# Patient Record
Sex: Male | Born: 1985
Health system: Southern US, Community
[De-identification: ages and names within clinical notes are randomized; demographics above are authoritative.]

## PROBLEM LIST (undated history)

## (undated) DIAGNOSIS — M25571 Pain in right ankle and joints of right foot: Secondary | ICD-10-CM

## (undated) DIAGNOSIS — G4700 Insomnia, unspecified: Secondary | ICD-10-CM

## (undated) DIAGNOSIS — Z720 Tobacco use: Secondary | ICD-10-CM

## (undated) DIAGNOSIS — F1021 Alcohol dependence, in remission: Secondary | ICD-10-CM

## (undated) DIAGNOSIS — B1920 Unspecified viral hepatitis C without hepatic coma: Secondary | ICD-10-CM

## (undated) HISTORY — DX: Unspecified viral hepatitis C without hepatic coma: B19.20

## (undated) HISTORY — DX: Pain in right ankle and joints of right foot: M25.571

## (undated) HISTORY — DX: Alcohol dependence, in remission: F10.21

## (undated) HISTORY — DX: Tobacco use: Z72.0

## (undated) HISTORY — DX: Insomnia, unspecified: G47.00

---

## 2015-05-30 ENCOUNTER — Emergency Department (INDEPENDENT_AMBULATORY_CARE_PROVIDER_SITE_OTHER)
Admission: EM | Admit: 2015-05-30 | Discharge: 2015-05-30 | Disposition: A | Discharge status: Home / Self Care | Payer: Self-pay | Source: Home / Self Care | Attending: Family Medicine | Admitting: Family Medicine

## 2015-05-30 ENCOUNTER — Encounter (HOSPITAL_COMMUNITY): Payer: Self-pay | Admitting: Emergency Medicine

## 2015-05-30 DIAGNOSIS — I7300 Raynaud's syndrome without gangrene: Secondary | ICD-10-CM

## 2015-05-30 MED ORDER — TRAZODONE HCL 100 MG PO TABS: 100.0000 mg | ORAL_TABLET | Freq: Every day | ORAL | Status: AC

## 2015-05-30 MED ORDER — GABAPENTIN 300 MG PO CAPS: 300.0000 mg | ORAL_CAPSULE | Freq: Three times a day (TID) | ORAL | Status: AC

## 2015-05-30 NOTE — ED Notes (Signed)
No fax received.  Family reports paper needed to be signed and refaxed to facility

## 2015-05-30 NOTE — ED Notes (Signed)
Patient has multiple issues.  Discharged from a rehab center on Friday 8/26, in [place].  Patient is in this area with family.  Family is concerned fro cyanotic appearance of bilateral thumbs and adjoining palms, right worse than left.  This has been present for 7 months.  No new changes.  Also, patient did not receive any prescriptions on discharge.  Concerned for sudden stop of medicines

## 2015-05-30 NOTE — ED Provider Notes (Signed)
CSN: [id number]     Arrival date & time 05/30/15  1457 History   First MD Initiated Contact with Patient 05/30/15 1627     Chief Complaint  Patient presents with  . Hand Problem   (Consider location/radiation/quality/duration/timing/severity/associated sxs/prior Treatment) Patient is a 29 y.o. male presenting with hand pain. The history is provided by the patient, a relative and a parent.  Hand Pain This is a chronic problem. Episode onset: 7 mos of cyanosis and numbness of right hand with transfer on fri from drug rehab center in [place]., here for complete health checkup eval and med refills. The problem has not changed since onset.Pertinent negatives include no chest pain, no abdominal pain and no shortness of breath.    No past medical history on file. No past surgical history on file. No family history on file. Social History  Substance Use Topics  . Smoking status: Current Every Day Smoker  . Smokeless tobacco: Not on file  . Alcohol Use: No    Review of Systems  Constitutional: Positive for fatigue. Negative for fever.  HENT: Positive for congestion, postnasal drip and rhinorrhea.   Respiratory: Negative for shortness of breath.        Current smoker  Cardiovascular: Negative.  Negative for chest pain.  Gastrointestinal: Negative.  Negative for abdominal pain.  Genitourinary: Negative.   Musculoskeletal: Negative.   Skin: Positive for color change and pallor.    Allergies  Review of patient's allergies indicates no known allergies.  Home Medications   Prior to Admission medications   Medication Sig Start Date End Date Taking? Authorizing Provider  OVER THE COUNTER MEDICATION Clonopin Neurontin trazadone   Yes Historical Provider, MD  gabapentin (NEURONTIN) 300 MG capsule Take 1 capsule (300 mg total) by mouth 3 (three) times daily. 05/30/15   Linna Hoff, MD  traZODone (DESYREL) 100 MG tablet Take 1 tablet (100 mg total) by mouth at bedtime. 05/30/15   Linna Hoff,  MD   Meds Ordered and Administered this Visit  Medications - No data to display  BP 137/92 mmHg  Pulse 58  Temp(Src) 98.3 F (36.8 C) (Oral)  Resp 16  SpO2 100% No data found.   Physical Exam  Constitutional: He is oriented to person, place, and time. He appears well-developed and well-nourished. No distress.  Eyes: Conjunctivae are normal. Pupils are equal, round, and reactive to light.  Neck: Normal range of motion. Neck supple.  Cardiovascular: Normal heart sounds.   Abdominal: There is no tenderness.  Lymphadenopathy:    He has no cervical adenopathy.  Neurological: He is alert and oriented to person, place, and time.  Skin: Rash noted. There is erythema.  Acral cyanosis of right hand, pulses 2+, full rom., sl milder sx to left hand  Nursing note and vitals reviewed.   ED Course  Procedures (including critical care time)  Labs Review Labs Reviewed - No data to display  Imaging Review No results found.   Visual Acuity Review  Right Eye Distance:   Left Eye Distance:   Bilateral Distance:    Right Eye Near:   Left Eye Near:    Bilateral Near:         MDM   1. Raynaud's phenomenon (secondary)        Linna Hoff, MD 05/30/15 [phone]

## 2015-05-30 NOTE — Discharge Instructions (Signed)
Use medicine as prescribed, see your doctor as planned. °

## 2015-06-03 ENCOUNTER — Other Ambulatory Visit: Payer: Self-pay | Admitting: Internal Medicine

## 2015-06-03 ENCOUNTER — Ambulatory Visit
Admission: RE | Admit: 2015-06-03 | Discharge: 2015-06-03 | Disposition: A | Discharge status: Home / Self Care | Payer: No Typology Code available for payment source | Source: Ambulatory Visit | Attending: Internal Medicine | Admitting: Internal Medicine

## 2015-06-03 DIAGNOSIS — M79644 Pain in right finger(s): Secondary | ICD-10-CM

## 2015-06-23 ENCOUNTER — Telehealth: Payer: Self-pay | Admitting: Lab

## 2015-06-23 NOTE — Telephone Encounter (Signed)
Lm for pt's Aunt to call me.  It is stated on referral that the pt is self pay-need to clarify because labs and meds are so expensive. Pt will need lab appmt.

## 2015-06-27 ENCOUNTER — Other Ambulatory Visit: Payer: Self-pay

## 2015-06-27 NOTE — Telephone Encounter (Signed)
On 9/23-In speaking with pt's Aunt-Denya Haymore, she said that the pt was in the process of getting approve for the charity program offered by [facility].  He couldn't qualify for the orange card because he is not a [facility] resident.  She said the paperwork should be completed by this day or the early part of next week.  She wanted me to hold a spot for her nephew but I told her I coudnt do that because I have other pts that treatment is on hold until they can get financial assistance in place.  She then calls me today stating that she had spoken with Pam-the financial aide person here-and Pam had stated that [facility] could get 55% discount when self pay and she wanted to make him an appmt based on that information.  In discussing this situation with Dave-he advised me to stop all contact with Denya until there is a "designated party of release" form signed because the pt is an adult.  So I shared this information with Denya and she said she would have Jonahtan call me today to make an appmt.  HE NEEDS LAB APPMT FIRST.

## 2015-06-27 NOTE — Telephone Encounter (Signed)
Pcp's office notified of status-Pt called-he is scheduled  for labs on 06/28/15  pm

## 2015-06-28 ENCOUNTER — Other Ambulatory Visit: Payer: Self-pay

## 2015-06-28 DIAGNOSIS — B182 Chronic viral hepatitis C: Secondary | ICD-10-CM

## 2015-06-28 NOTE — Telephone Encounter (Signed)
Pt came in to have labs today and was given an appmt with Dr. Luciana Axe for 07/27/15 @ 940.   Pcp's office informed (Tammy)

## 2015-07-05 ENCOUNTER — Other Ambulatory Visit: Payer: Self-pay | Admitting: Internal Medicine

## 2015-07-05 DIAGNOSIS — B1920 Unspecified viral hepatitis C without hepatic coma: Secondary | ICD-10-CM

## 2015-07-06 LAB — HEPATITIS C GENOTYPE

## 2015-07-07 ENCOUNTER — Other Ambulatory Visit: Payer: Self-pay

## 2015-07-07 ENCOUNTER — Other Ambulatory Visit: Payer: Self-pay | Admitting: Internal Medicine

## 2015-07-07 ENCOUNTER — Ambulatory Visit (HOSPITAL_COMMUNITY)
Admission: RE | Admit: 2015-07-07 | Discharge: 2015-07-07 | Disposition: A | Discharge status: Home / Self Care | Payer: Self-pay | Source: Ambulatory Visit | Attending: Internal Medicine | Admitting: Internal Medicine

## 2015-07-07 DIAGNOSIS — B1920 Unspecified viral hepatitis C without hepatic coma: Secondary | ICD-10-CM

## 2015-07-07 DIAGNOSIS — B182 Chronic viral hepatitis C: Secondary | ICD-10-CM | POA: Insufficient documentation

## 2015-07-07 LAB — BLOOD GAS, VENOUS

## 2015-07-08 LAB — HEPATITIS C RNA QUANTITATIVE
HCV QUANT LOG: 5.48 {Log} — AB (ref <1.18)
HCV QUANT: 302337 [IU]/mL — AB (ref <15)

## 2015-07-08 LAB — HIV ANTIBODY (ROUTINE TESTING W REFLEX): HIV: NONREACTIVE

## 2015-07-08 LAB — HEPATITIS B SURFACE ANTIBODY,QUALITATIVE: HEP B S AB: POSITIVE — AB

## 2015-07-08 LAB — HEPATITIS B SURFACE ANTIGEN: HEP B S AG: NEGATIVE

## 2015-07-11 LAB — HEPATITIS C RNA QUANTITATIVE

## 2015-07-13 LAB — HEPATITIS C GENOTYPE

## 2015-07-18 ENCOUNTER — Ambulatory Visit (INDEPENDENT_AMBULATORY_CARE_PROVIDER_SITE_OTHER): Payer: Self-pay | Admitting: Internal Medicine

## 2015-07-18 ENCOUNTER — Encounter: Payer: Self-pay | Admitting: Internal Medicine

## 2015-07-18 VITALS — BP 121/77 | HR 73 | Temp 97.7°F | Ht 72.0 in | Wt 180.0 lb

## 2015-07-18 DIAGNOSIS — F1021 Alcohol dependence, in remission: Secondary | ICD-10-CM

## 2015-07-18 DIAGNOSIS — G4700 Insomnia, unspecified: Secondary | ICD-10-CM | POA: Insufficient documentation

## 2015-07-18 DIAGNOSIS — B182 Chronic viral hepatitis C: Secondary | ICD-10-CM

## 2015-07-18 DIAGNOSIS — Z23 Encounter for immunization: Secondary | ICD-10-CM

## 2015-07-18 NOTE — Telephone Encounter (Signed)
Pt was scheduled to see Dr. Luciana Axeomer on 10/26-Tammy rescheduled pt with Dr. Drue SecondSnider for 07/18/15

## 2015-07-19 MED ORDER — ELBASVIR-GRAZOPREVIR 50-100 MG PO TABS
1.0000 | ORAL_TABLET | Freq: Every day | ORAL | Status: DC
Start: 2015-07-19 — End: 2015-08-02

## 2015-07-19 NOTE — Progress Notes (Signed)
Patient ID: Jonathan Robinson, male   DOB: Oct 08, 1985, 29 y.o.   MRN: [id number]      [facility] for Infectious Disease   CC: consideration for treatment for chronic hepatitis C without hepatic coma  HPI:  Jonathan Robinson is a 29 y.o. male who presents for initial evaluation and management of chronic hepatitis C.  Patient tested positive in 2016. Hepatitis C-associated risk factors present are: injection drug use, which he has stopped and has completed drug treatment program, sober since July 22nd 2016. Patient has been in drug/opiate and alcohol counseling since then. He finished a 5 wk residential program, now living with family in [place] area, uncle is his sponsor. He reported that he started injection drug use roughly 7 years ago, but has remained sober now for 3 months. He is on gabapentin  Q6hr which he would like to cut back. Patient has had other studies performed. Results: hepatitis C ab, genotype 1a. VL 30,000. Patient has not had prior treatment for Hepatitis C. Patient does not have a past history of liver disease. Patient does not have a family history of liver disease. Patient does not  have associated signs or symptoms related to liver disease.  Labs reviewed and confirm chronic hepatitis C with a positive viral load.    Records reviewed from his PCP who started work up on HCV   Patient does not have documented immunity to Hepatitis A. Patient does not have documented immunity to Hepatitis B.  He is to bring with him his immunization record at next visit to start vaccine series  Review of Systems:  Review of Systems  Constitutional: Negative for fever, chills, diaphoresis, activity change, appetite change, fatigue and unexpected weight change.  HENT: positive for congestion, seasonal allergies., sore throat, rhinorrhea, sneezing, trouble swallowing and sinus pressure.  Eyes: Negative for photophobia and visual disturbance.  Respiratory: Negative for cough, chest  tightness, shortness of breath, wheezing and stridor.  Cardiovascular: Negative for chest pain, palpitations and leg swelling.  Gastrointestinal: Negative for nausea, vomiting, abdominal pain, diarrhea, constipation, blood in stool, abdominal distention and anal bleeding.  Genitourinary: Negative for dysuria, hematuria, flank pain and difficulty urinating.  Musculoskeletal: Negative for myalgias, back pain, joint swelling, arthralgias and gait problem.  Skin: occasional change in color to right hand when cold. Negative for color change, pallor, rash and wound.  Neurological: Negative for dizziness, tremors, weakness and light-headedness.  Hematological: Negative for adenopathy. Does not bruise/bleed easily.  Psychiatric/Behavioral: positive for insomnia. Negative for behavioral problems, confusion, sleep disturbance, dysphoric mood, decreased concentration and agitation.    Past Medical History  Diagnosis Date  . Alcoholism in remission (HCC)   . Insomnia   . Tobacco abuse   . Right ankle pain   . Hepatitis C     Prior to Admission medications   Medication Sig Start Date End Date Taking? Authorizing Provider  gabapentin (NEURONTIN) 300 MG capsule Take 1 capsule (300 mg total) by mouth 3 (three) times daily. 05/30/15  Yes Linna Hoff, MD  traZODone (DESYREL) 100 MG tablet Take 1 tablet (100 mg total) by mouth at bedtime. 05/30/15  Yes Linna Hoff, MD    No Known Allergies  Social History  Substance Use Topics  . Smoking status: Current Every Day Smoker -- 0.50 packs/day    Types: Cigarettes    Start date: 10/02/2007  . Smokeless tobacco: Never Used  . Alcohol Use: No     Comment: per patient stopped 04/01/15    Family  history: alcoholism + in the family. No hx of liver disease. No CAD. No DM   Objective:  BP 121/77 mmHg  Pulse 73  Temp(Src) 97.7 F (36.5 C) (Oral)  Ht 6' (1.829 m)  Wt 180 lb (81.647 kg)  BMI 24.41 kg/m2  gen = a xo by 4 in NAD HEENT: no oral  pharyngeal erythema, good dentition Eyes: anicteric Cardiovascular: RRR, no g/m/r Respiratory: CTAB, no w/c/r Gastrointestinal: Bowel sounds are normal, liver is not enlarged, spleen is not enlarged Musculoskeletal: peripheral pulses normal, no pedal edema, no clubbing or cyanosis Skin: right dorsum of hand has slight mottled discoloraiton, but hand is warm. no porphyria cutanea tarda Lymphatic: no cervical lymphadenopathy   Laboratory Genotype:  Lab Results  Component Value Date   HCVGENOTYPE 1a 07/07/2015   HCV viral load:  Lab Results  Component Value Date   HCVQUANT [id number]* 07/07/2015   No results found for: WBC, HGB, HCT, MCV, PLT No results found for: CREATININE, BUN, NA, K, CL, CO2 No results found for: ALT, AST, GGT, ALKPHOS   Labs and history reviewed and show CHILD-PUGH A  5-6 points: Child class A 7-9 points: Child class B 10-15 points: Child class C  No results found for: INR, BILITOT, ALBUMIN   Assessment: New Patient with Chronic Hepatitis C genotype 1a, no hepatic coma untreated.   I discussed with the patient the lab findings that confirm chronic hepatitis C as well as the natural history and progression of disease including about 30% of people who develop cirrhosis of the liver if left untreated and once cirrhosis is established there is a 2-7% risk per year of liver cancer and liver failure.  I discussed the importance of treatment and benefits in reducing the risk, even if significant liver fibrosis exists.   Plan: 1) Patient counseled extensively on limiting acetaminophen to no more than 2 grams daily, avoidance of alcohol. 2) Transmission discussed with patient including sexual transmission, sharing razors and toothbrush.   3) Will need referral to gastroenterology if concern for cirrhosis 4) Will need referral for substance abuse counseling: yes; Further work up to include urine drug screen  Yes, at next visit. Patient would like counseling to help wean  off of gabapentin 5) Will prescribe Harvoni for 12 weeks 6) Hepatitis A vaccine likely, awaiting for imms results 7) Hepatitis B vaccine : awaiting imms result 8) Pneumovax vaccine if concern for cirrhosis 9) Further work up to include liver staging with elastography 10) will follow up after U/S

## 2015-07-20 ENCOUNTER — Ambulatory Visit
Admission: RE | Admit: 2015-07-20 | Discharge: 2015-07-20 | Disposition: A | Discharge status: Home / Self Care | Payer: Self-pay | Source: Ambulatory Visit | Attending: Internal Medicine | Admitting: Internal Medicine

## 2015-07-20 ENCOUNTER — Other Ambulatory Visit: Payer: Self-pay | Admitting: Internal Medicine

## 2015-07-20 DIAGNOSIS — B182 Chronic viral hepatitis C: Secondary | ICD-10-CM | POA: Insufficient documentation

## 2015-07-27 ENCOUNTER — Encounter: Payer: Self-pay | Admitting: Internal Medicine

## 2015-07-28 LAB — HCV VIRAL RNA GEN3 NS5A DRUG RESIST: HCV NS5A SUBTYPE: NOT DETECTED

## 2015-08-02 ENCOUNTER — Other Ambulatory Visit: Payer: Self-pay | Admitting: Internal Medicine

## 2015-08-02 ENCOUNTER — Ambulatory Visit: Payer: Self-pay | Admitting: *Deleted

## 2015-08-02 DIAGNOSIS — B182 Chronic viral hepatitis C: Secondary | ICD-10-CM

## 2015-08-02 DIAGNOSIS — F1193 Opioid use, unspecified with withdrawal: Secondary | ICD-10-CM

## 2015-08-02 DIAGNOSIS — F1010 Alcohol abuse, uncomplicated: Secondary | ICD-10-CM

## 2015-08-02 MED ORDER — ELBASVIR-GRAZOPREVIR 50-100 MG PO TABS: 1.0000 | ORAL_TABLET | Freq: Every day | ORAL | Status: AC

## 2015-08-02 MED ORDER — RIBAVIRIN 200 MG PO CAPS: 600.0000 mg | ORAL_CAPSULE | Freq: Two times a day (BID) | ORAL | Status: AC

## 2015-08-02 NOTE — BH Specialist Note (Signed)
Jonathan Robinson was present for his scheduled appointment today with counselor.  Client was oriented times four with good affect and dress.  Client was alert and talkative.  Client was a bit nervous as evidenced by not giving much eye contact tat the beginning of appointment, fidgeting with his limbs, talking only minimally again at the beginning of appointment.  After awhile, client began to open up and share more freely. Client indicated that he has gone through a month of inpatient treatment in [place] and is now out and trying to continue with his recovery process. Client says that he has been clean 3 months but is concerned about being placed on Neurontin 4 pills a day at 1200 mg.  Client says that he has withdrawals if he does not take it.  Counselor encouraged client to talk with his doctor in order to ween off this medication.  Doctor was consulted with client who communicated to client to take only three pills a day instead of four. Client agreed that he would try this.  Counselor encouraged client to meet weekly right now until otherwise adjusted to ensure sobriety and a successful process of coming off the Neurontin. Counselor educated client on external and internal triggers.  Counselor discussed with client about the importance of avoiding high risk situations right now. Client agreed and stated that he is literally not living in the same town to avoid certain people he used to use with.  Client reports a serious family history of substance abuse including but not limited too: his father, mother, and three brothers.  Client shared that he truly desires sobriety as he has a 28 year old daughter and another baby on the way in April.  Counselor provided support and encouragement accordingly.  Janee Ureste, MA, LPCA [facility]

## 2015-08-04 ENCOUNTER — Encounter: Payer: Self-pay | Admitting: Pharmacy Technician

## 2015-08-09 ENCOUNTER — Ambulatory Visit: Payer: Self-pay | Admitting: *Deleted

## 2015-08-18 ENCOUNTER — Ambulatory Visit: Payer: Self-pay | Admitting: *Deleted

## 2015-08-18 ENCOUNTER — Ambulatory Visit: Payer: Self-pay | Admitting: Pharmacist Clinician (PhC)/ Clinical Pharmacy Specialist

## 2015-08-18 DIAGNOSIS — F1010 Alcohol abuse, uncomplicated: Secondary | ICD-10-CM

## 2015-08-18 DIAGNOSIS — B182 Chronic viral hepatitis C: Secondary | ICD-10-CM

## 2015-08-18 NOTE — Progress Notes (Signed)
Patient ID: Jonathan Robinson, male   DOB: 1986-01-16, 29 y.o.   MRN: [id number] HPI: Jonathan Robinson is a 29 y.o. male for his 2 wks f/u for his Hep C.   Lab Results  Component Value Date   HCVGENOTYPE 1a 07/07/2015    Allergies: No Known Allergies  Vitals:    Past Medical History: Past Medical History  Diagnosis Date  . Alcoholism in remission (HCC)   . Insomnia   . Tobacco abuse   . Right ankle pain   . Hepatitis C     Social History: Social History   Social History  . Marital Status: Single    Spouse Name: N/A  . Number of Children: N/A  . Years of Education: N/A   Social History Main Topics  . Smoking status: Current Every Day Smoker -- 0.50 packs/day    Types: Cigarettes    Start date: 10/02/2007  . Smokeless tobacco: Never Used  . Alcohol Use: No     Comment: per patient stopped 04/01/15  . Drug Use: No     Comment: per patient stopped 04/01/15  . Sexual Activity: Not Currently   Other Topics Concern  . Not on file   Social History Narrative    Labs: HEP B S AB (no units)  Date Value  07/07/2015 POS*   HEPATITIS B SURFACE AG (no units)  Date Value  07/07/2015 NEGATIVE    Lab Results  Component Value Date   HCVGENOTYPE 1a 07/07/2015    Hepatitis C RNA quantitative Latest Ref Rng 07/07/2015 06/28/2015  HCV Quantitative <15 IU/mL 302337(H) CANCELED  HCV Quantitative Log <1.18 log 10 5.48(H) CANCELED    No results found for: AST, ALT, INR  CrCl: CrCl cannot be calculated (Unknown ideal weight.).  Fibrosis Score: F0/1 as assessed by ARFI  Child-Pugh Score: Class A  Previous Treatment Regimen: Naive  Assessment: 29 yo who was recently started on Zeptatier/riba for his Hep C 1a. He has big issue with his IVDA so we HAD some major issue with his blood draws in the past. We had to send him to inpt to get an arterial sticks last time and they couldn't do the NS5A at the time. Therefore, we are giving him the extended course of Zepatier with riba  just in case he does have NS5A resistance. He gets great support from his aunt Jonathan Robinson. He has not missed any doses so far. He complained of some early fatigue after starting the regimen but it has gotten much better. We would like to get a CBC at this point but blood draws would be an issue. Therefore, we are going to wait 2 more weeks to try to get a CBC and VL at that time. If we have to send him over to inpt to do a arterial stick, we may need to do that again.   Recommendations: Cont zepatier and ribavirin Labs in 2 weeks  Jonathan Robinson, Jonathan Robinson, VermontPharm.D., BCPS, AAHIVP Clinical Infectious Disease Pharmacist [facility] 08/18/2015, 2:31 PM

## 2015-08-18 NOTE — BH Specialist Note (Signed)
Jonathan Robinson was present for his scheduled appointment today.  Client was oriented times four with good affect and dress.  Client was alert and somewhat talkative.  Client was accompanied by his older cousin.  She was engaged and discussed client's needs and history.  Client was receptive and gave good feedback.  Client reported that he has now been clean for 3 1/2 months.  Client indicated that he is involved with a support group at his church. Counselor educated client about tips for maintaining sobriety during the holidays. Client was receptive and listened attentively sharing occasionally. Counselor encouraged client to get involved in AA for added support and to develop some friendships/relationships outside of family. Counselor worked with client on prepared statements to communicate should the need arise during the holidays expressing that he is in recovery and no longer drinking. Client made another appointment to meet with counselor in a month.  Jenel LucksJodi Steffon Gladu, MA, LPCA [facility]

## 2015-09-01 ENCOUNTER — Other Ambulatory Visit: Payer: Self-pay

## 2015-09-01 DIAGNOSIS — B182 Chronic viral hepatitis C: Secondary | ICD-10-CM

## 2015-09-01 LAB — CBC
HCT: 38.6 % — ABNORMAL LOW (ref 39.0–52.0)
HEMOGLOBIN: 12.9 g/dL — AB (ref 13.0–17.0)
MCH: 30.7 pg (ref 26.0–34.0)
MCHC: 33.4 g/dL (ref 30.0–36.0)
MCV: 91.9 fL (ref 78.0–100.0)
MPV: 11.1 fL (ref 8.6–12.4)
Platelets: 237 10*3/uL (ref 150–400)
RBC: 4.2 MIL/uL — AB (ref 4.22–5.81)
RDW: 13.3 % (ref 11.5–15.5)
WBC: 7.2 10*3/uL (ref 4.0–10.5)

## 2015-09-04 LAB — HEPATITIS C RNA QUANTITATIVE: HCV Quantitative: NOT DETECTED IU/mL (ref <15)

## 2015-09-09 ENCOUNTER — Telehealth: Payer: Self-pay | Admitting: *Deleted

## 2015-09-09 NOTE — Telephone Encounter (Signed)
Patient called for lab work results. Advised that his Hep C viral load is undetectable. Jonathan Robinson

## 2015-09-19 ENCOUNTER — Ambulatory Visit: Payer: Self-pay | Admitting: *Deleted

## 2015-10-10 ENCOUNTER — Ambulatory Visit: Payer: Self-pay | Admitting: *Deleted

## 2015-10-27 MED FILL — RIBAVIRIN 200 MG TABLET: 200 | 30 days supply | Qty: 180 | Fill #0

## 2015-11-25 IMAGING — US US ABDOMEN COMPLETE W/ ELASTOGRAPHY
1 series · 13 of 25 positions shown · non-contrast
Comparison: None.

CLINICAL DATA: Chronic hepatitis-C without hepatic coma



[Series 1: us abdomen complete w/ elastography · 0.17mm/px · 13 of 94 slices shown]
[im 1/94]
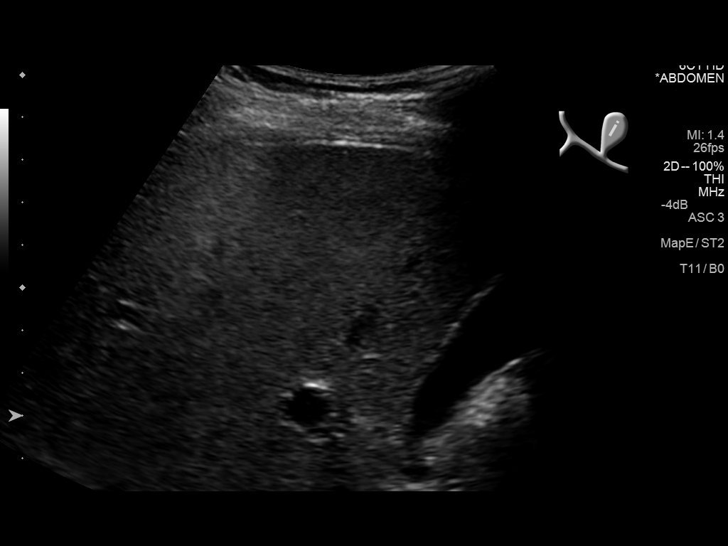
[im 8/94]
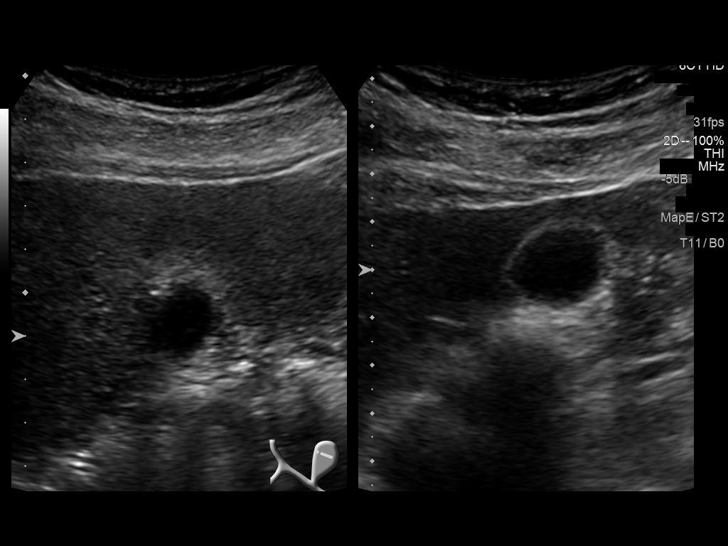
[im 16/94]
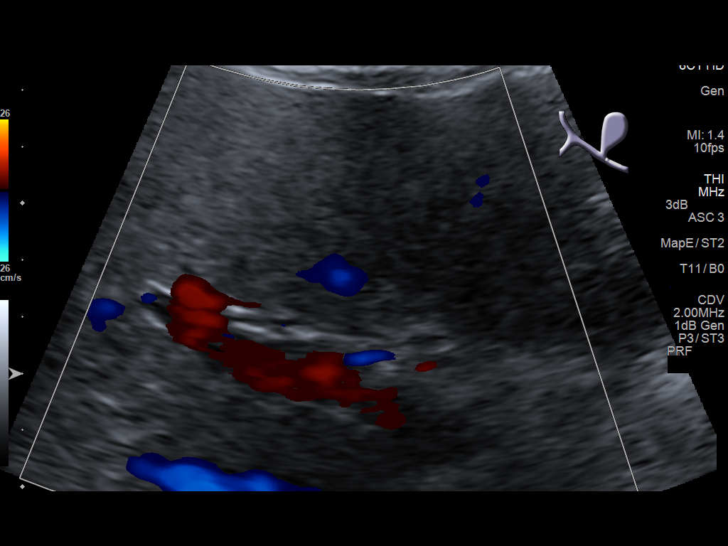
[im 24/94]
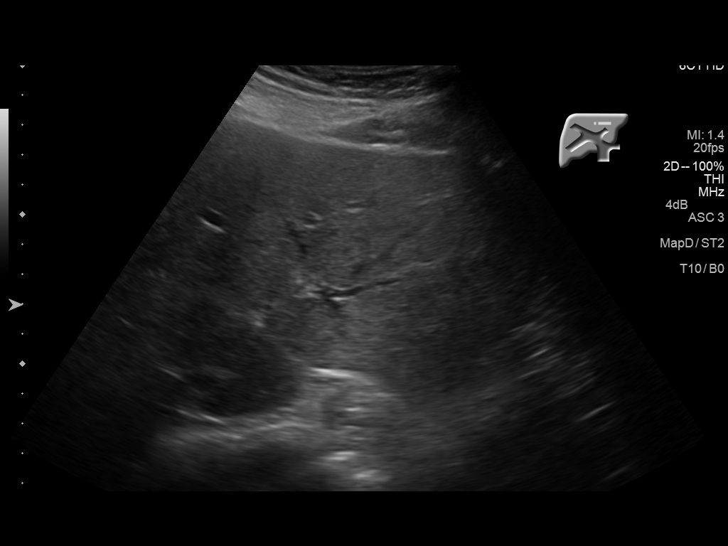
[im 32/94]
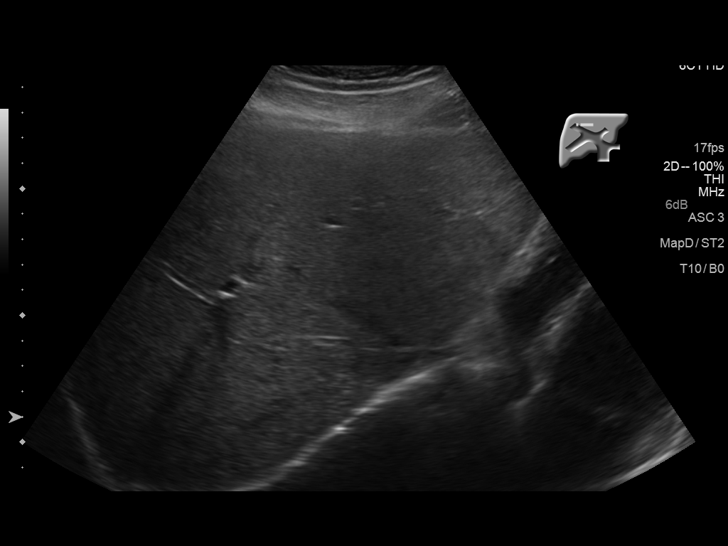
[im 39/94]
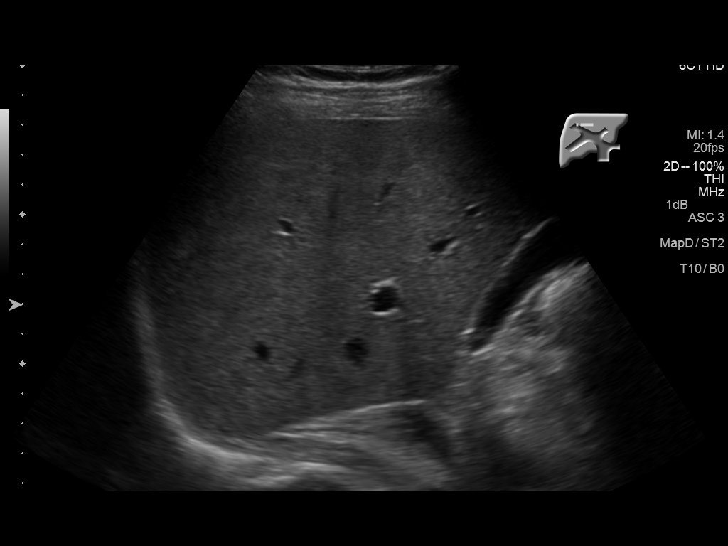
[im 47/94]
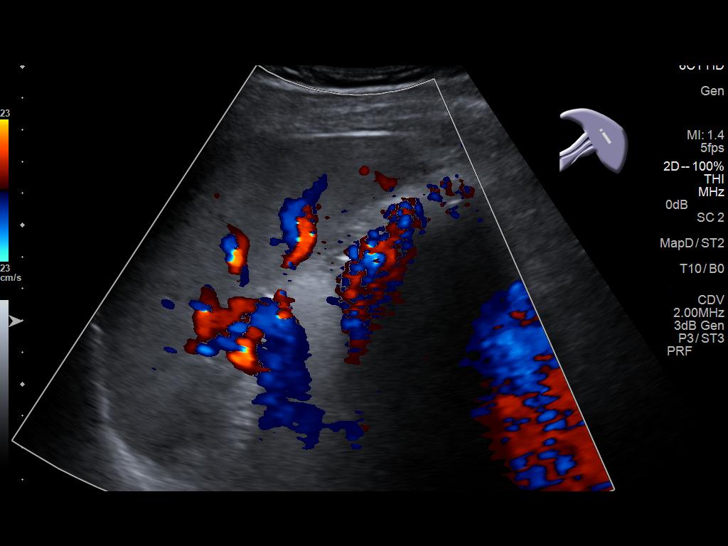
[im 55/94]
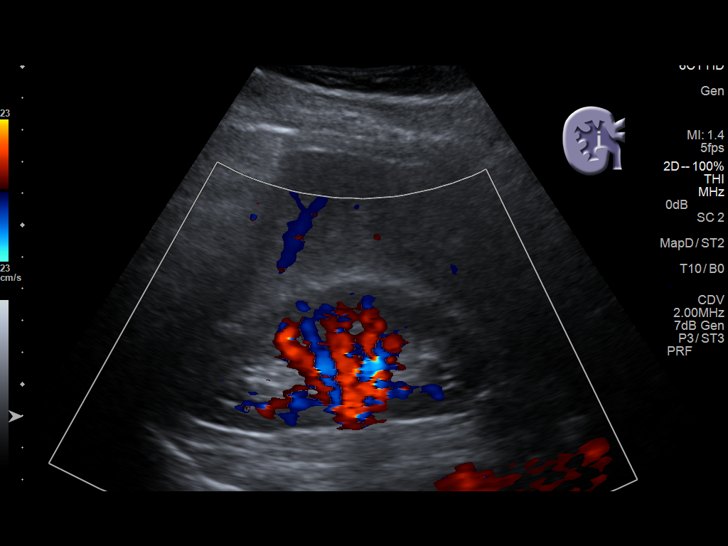
[im 63/94]
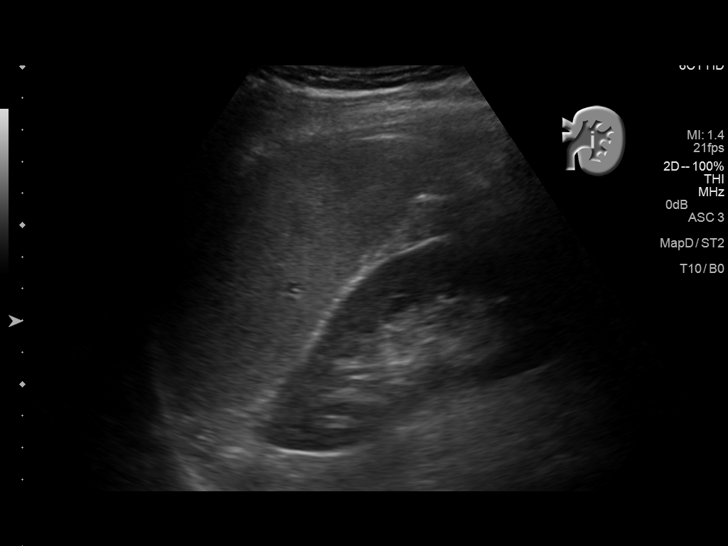
[im 70/94]
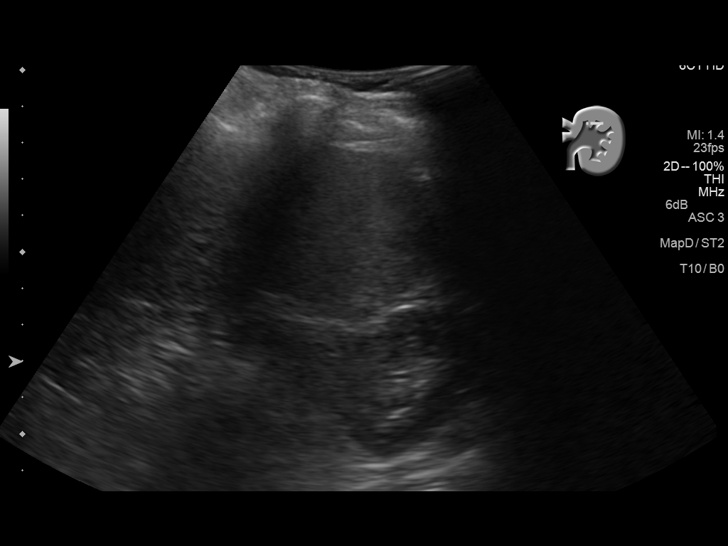
[im 78/94]
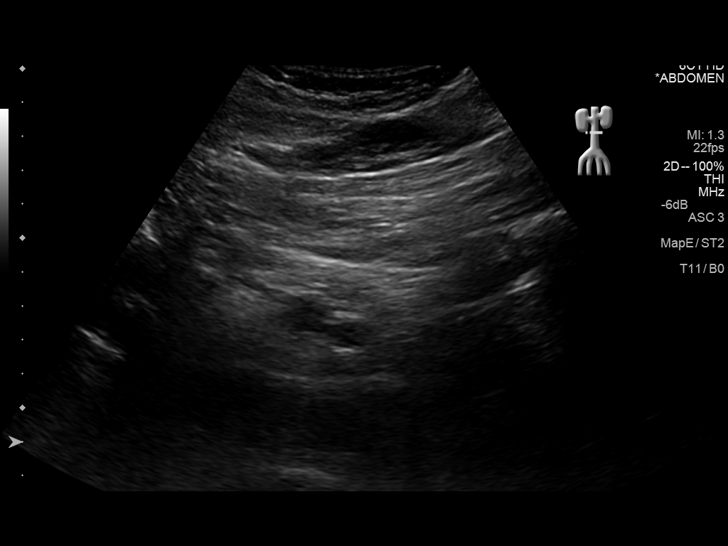
[im 86/94]
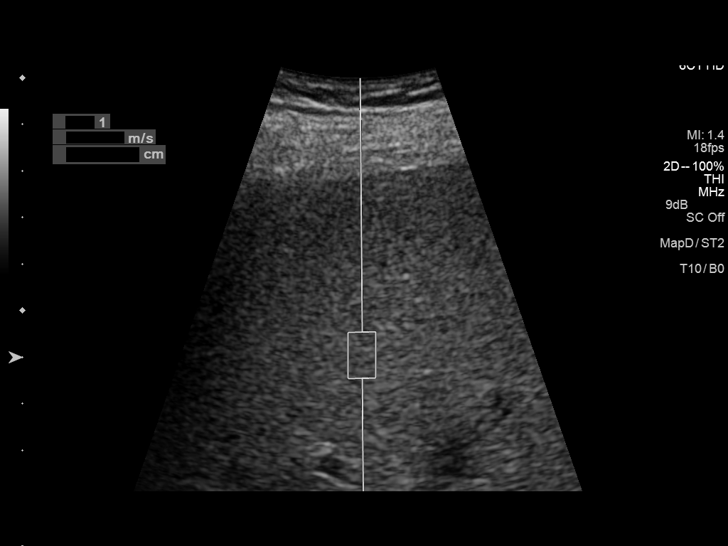
[im 94/94]
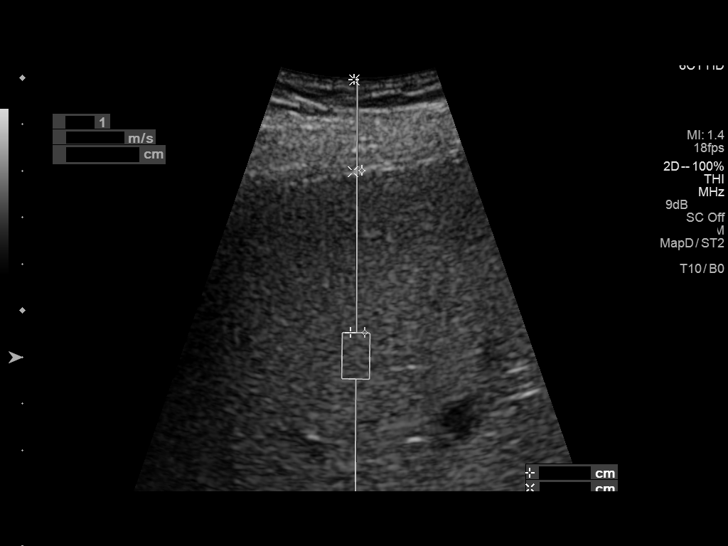

[13 of 25 positions shown; findings below may reference images not displayed]

FINDINGS: ULTRASOUND ABDOMEN

Gallbladder: No gallstones or wall thickening visualized. No
sonographic Murphy sign noted.

Common bile duct: Diameter: 3.0 mm

Liver: No focal lesion identified. Within normal limits in
parenchymal echogenicity.

IVC: No abnormality visualized.

Pancreas: Visualized portion unremarkable.

Spleen: Size and appearance within normal limits.

Right Kidney: Length: 10.5 cm. Echogenicity within normal limits. No
mass or hydronephrosis visualized.

Left Kidney: Length: 11.0 cm. Echogenicity within normal limits. No
mass or hydronephrosis visualized.

Abdominal aorta: No aneurysm visualized.

Other findings: None.

ULTRASOUND HEPATIC ELASTOGRAPHY

Device: Siemens Helix VTQ

Transducer 6 C1

Patient position: Supine

Number of measurements:  10

Hepatic Segment:  8

Median velocity:   1.09  m/sec

IQR:

IQR/Median velocity ratio

Corresponding Metavir fibrosis score:  F 0/F 1

Risk of fibrosis: Minimal

Limitations of exam: None

Pertinent findings noted on other imaging exams:  None

Please note that abnormal shear wave velocities may also be
identified in clinical settings other than with hepatic fibrosis,
such as: acute hepatitis, elevated right heart and central venous
pressures including use of beta blockers, Leander disease
(Cris), infiltrative processes such as
mastocytosis/amyloidosis/infiltrative tumor, extrahepatic
cholestasis, in the post-prandial state, and liver transplantation.
Correlation with patient history, laboratory data, and clinical
condition recommended.
IMPRESSION: 1. Normal abdominal sonogram.

Median hepatic shear wave velocity is calculated at 1.09 m/sec.

Corresponding Metavir fibrosis score is F 0/F 1.

Risk of fibrosis is minimal.

Follow-up:  None indicated

## 2015-11-28 ENCOUNTER — Other Ambulatory Visit: Payer: Self-pay

## 2015-11-28 ENCOUNTER — Other Ambulatory Visit: Payer: Self-pay | Admitting: Internal Medicine

## 2015-11-28 DIAGNOSIS — B182 Chronic viral hepatitis C: Secondary | ICD-10-CM

## 2015-11-28 LAB — CBC WITH DIFFERENTIAL/PLATELET
BASOS ABS: 0 10*3/uL (ref 0.0–0.1)
BASOS PCT: 0 % (ref 0–1)
Eosinophils Absolute: 0.5 10*3/uL (ref 0.0–0.7)
Eosinophils Relative: 5 % (ref 0–5)
HCT: 41.4 % (ref 39.0–52.0)
HEMOGLOBIN: 13.2 g/dL (ref 13.0–17.0)
Lymphocytes Relative: 16 % (ref 12–46)
Lymphs Abs: 1.4 10*3/uL (ref 0.7–4.0)
MCH: 31 pg (ref 26.0–34.0)
MCHC: 31.9 g/dL (ref 30.0–36.0)
MCV: 97.2 fL (ref 78.0–100.0)
MONO ABS: 0.5 10*3/uL (ref 0.1–1.0)
MPV: 10.4 fL (ref 8.6–12.4)
Monocytes Relative: 5 % (ref 3–12)
NEUTROS ABS: 6.7 10*3/uL (ref 1.7–7.7)
NEUTROS PCT: 74 % (ref 43–77)
Platelets: 274 10*3/uL (ref 150–400)
RBC: 4.26 MIL/uL (ref 4.22–5.81)
RDW: 13.6 % (ref 11.5–15.5)
WBC: 9 10*3/uL (ref 4.0–10.5)

## 2015-11-28 NOTE — Addendum Note (Signed)
Addended byJimmy Picket F on: 11/28/2015 11:51 AM   Modules accepted: Orders

## 2015-11-29 LAB — HEPATITIS C RNA QUANTITATIVE: HCV Quantitative: NOT DETECTED IU/mL (ref <15)

## 2015-12-06 ENCOUNTER — Telehealth: Payer: Self-pay | Admitting: *Deleted

## 2015-12-06 NOTE — Telephone Encounter (Signed)
Shared that Hep C VL was undetectable and CBC was normal.  MD does the pt need to come back for another MD or lab visit?

## 2015-12-07 NOTE — Telephone Encounter (Signed)
Have him come back in 6 months

## 2015-12-08 NOTE — Telephone Encounter (Signed)
Shared Dr Ephriam Knucklesomer's message.  6 month appt will be in April, 2017.  Appt made with Dr. Drue SecondSnider.

## 2016-01-05 ENCOUNTER — Ambulatory Visit: Payer: Self-pay | Admitting: Internal Medicine

## 2016-07-02 IMAGING — CR DG HAND COMPLETE 3+V*R*
3 series · 3 of 3 positions shown · non-contrast
Comparison: None.

CLINICAL DATA: Right hand pain over the region of the thumb

EXAM:
RIGHT HAND - COMPLETE 3+ VIEW

[x hand pa right]
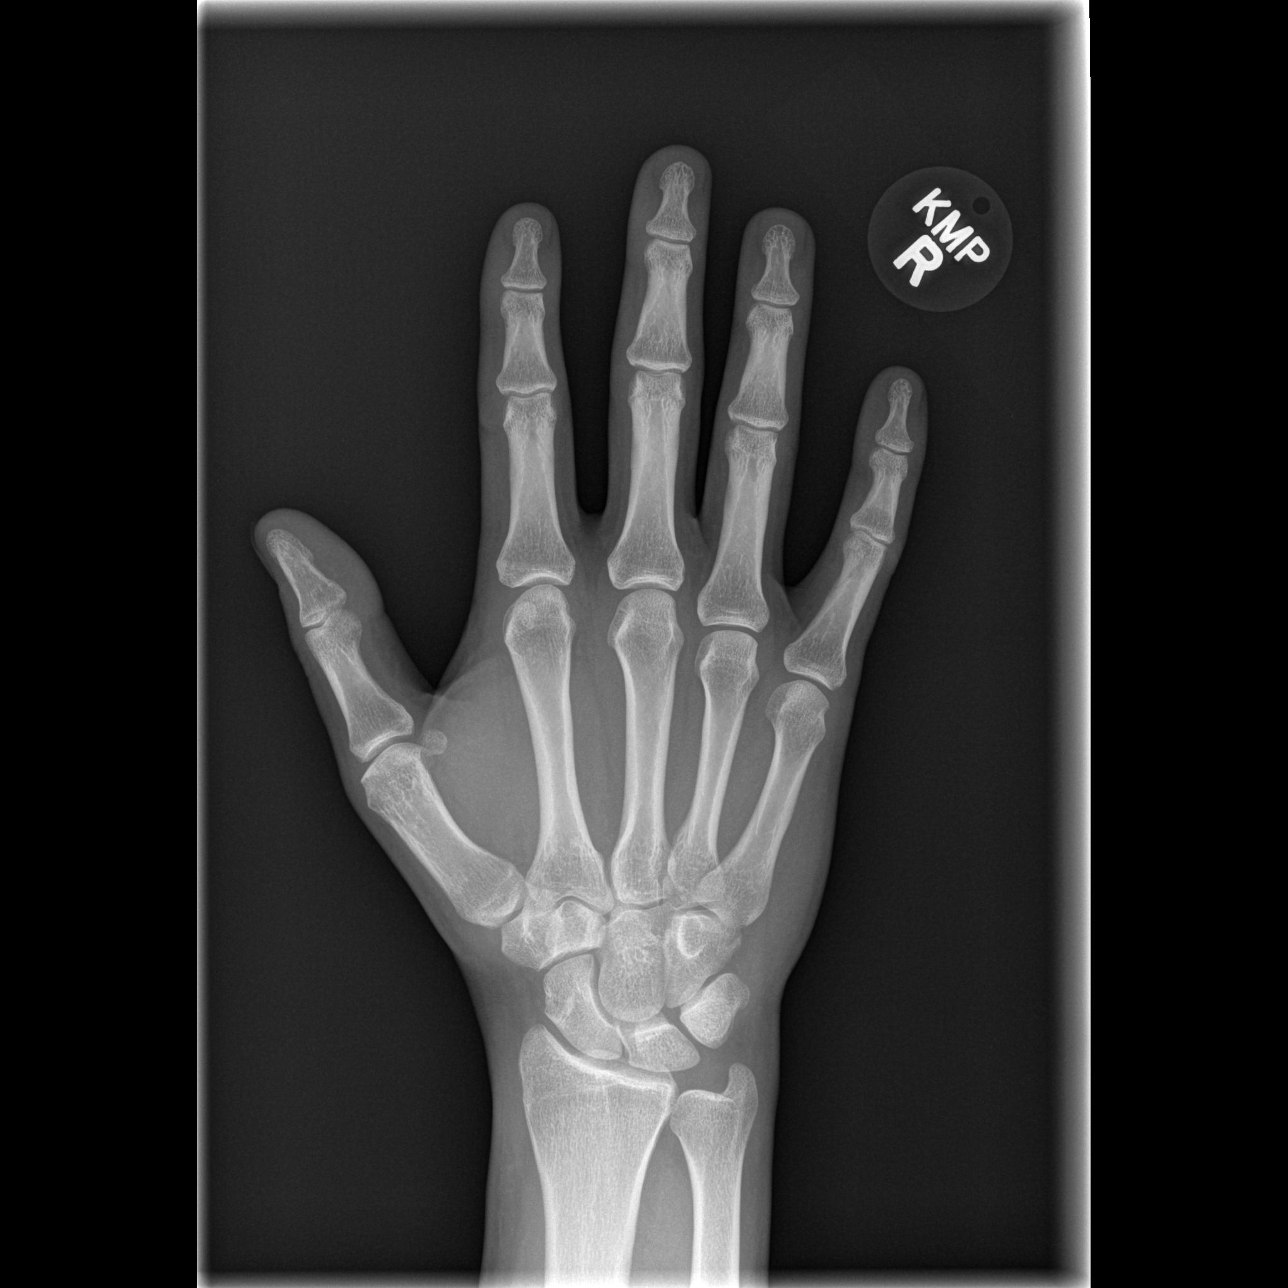

[x hand oblique right]
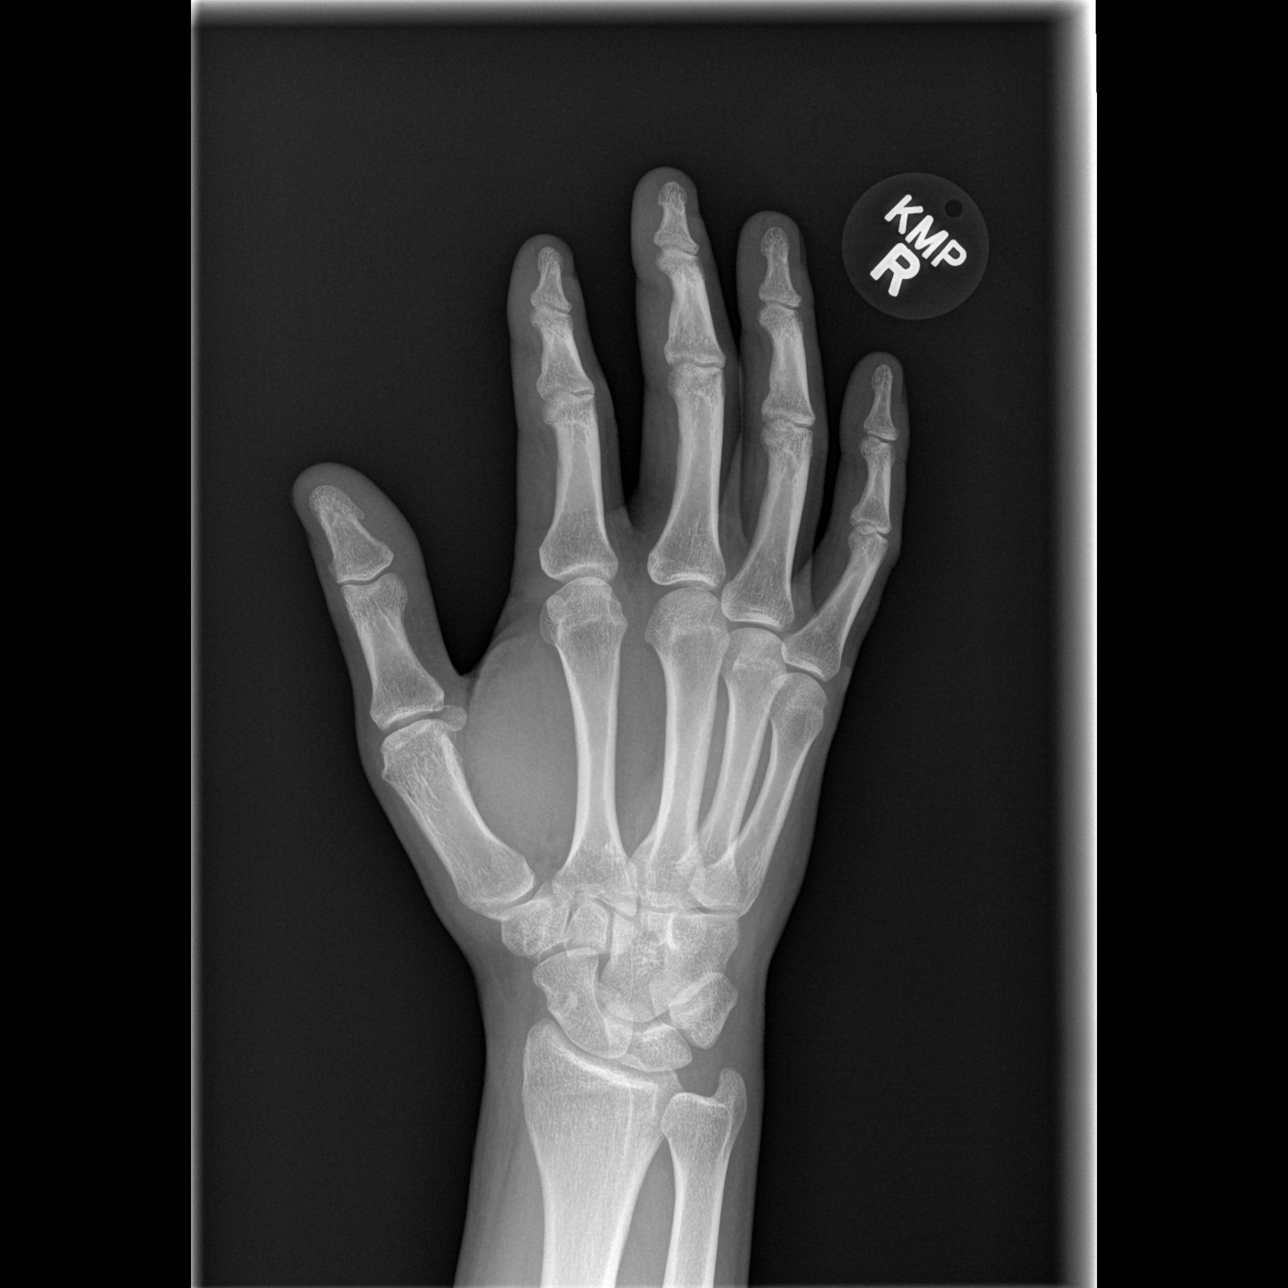

[x hand lat right]
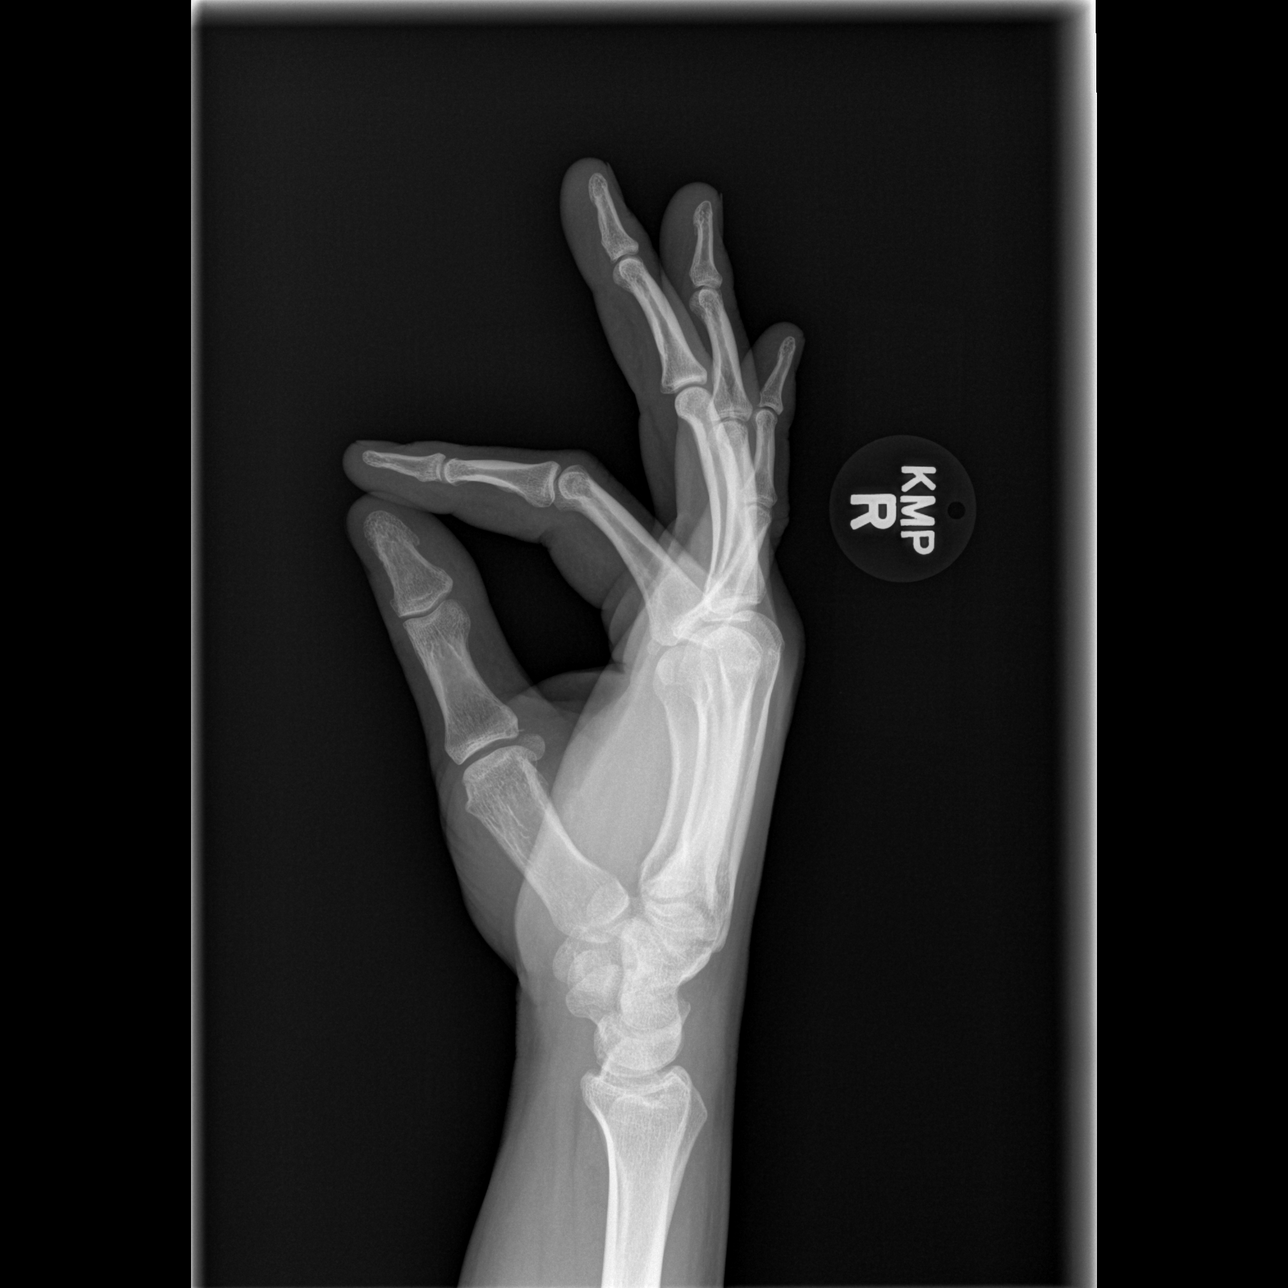

[3 of 3 positions shown; findings below may reference images not displayed]

FINDINGS: There is no evidence of fracture or dislocation. There is no
evidence of arthropathy or other focal bone abnormality. Soft
tissues are unremarkable.
IMPRESSION: Negative.

## 2016-07-19 ENCOUNTER — Telehealth: Payer: Self-pay | Admitting: *Deleted

## 2016-07-19 ENCOUNTER — Encounter: Payer: Self-pay | Admitting: *Deleted

## 2016-07-19 NOTE — Telephone Encounter (Signed)
Unable to contact patient by phone.  Will send the patient a letter requesting he call for follow-up lab appointment.

## 2021-12-30 DEATH — deceased
